# Patient Record
Sex: Female | Born: 1970 | Race: Black or African American | Hispanic: No | Marital: Single | Smoking: Current every day smoker
Health system: Southern US, Community
[De-identification: ages and names within clinical notes are randomized; demographics above are authoritative.]

## PROBLEM LIST (undated history)

## (undated) DIAGNOSIS — Z9289 Personal history of other medical treatment: Secondary | ICD-10-CM

## (undated) DIAGNOSIS — D649 Anemia, unspecified: Secondary | ICD-10-CM

## (undated) DIAGNOSIS — J45909 Unspecified asthma, uncomplicated: Secondary | ICD-10-CM

## (undated) DIAGNOSIS — K219 Gastro-esophageal reflux disease without esophagitis: Secondary | ICD-10-CM

## (undated) DIAGNOSIS — J302 Other seasonal allergic rhinitis: Secondary | ICD-10-CM

## (undated) HISTORY — PX: WISDOM TOOTH EXTRACTION: SHX21

## (undated) HISTORY — PX: TUBAL LIGATION: SHX77

---

## 1998-07-25 ENCOUNTER — Emergency Department (HOSPITAL_COMMUNITY): Admission: EM | Admit: 1998-07-25 | Discharge: 1998-07-25 | Payer: Self-pay

## 2000-09-09 ENCOUNTER — Encounter: Payer: Self-pay | Admitting: *Deleted

## 2000-09-09 ENCOUNTER — Ambulatory Visit (HOSPITAL_COMMUNITY): Admission: RE | Admit: 2000-09-09 | Discharge: 2000-09-09 | Payer: Self-pay | Admitting: *Deleted

## 2000-09-14 ENCOUNTER — Inpatient Hospital Stay (HOSPITAL_COMMUNITY): Admission: AD | Admit: 2000-09-14 | Discharge: 2000-09-14 | Payer: Self-pay | Admitting: *Deleted

## 2000-10-14 ENCOUNTER — Inpatient Hospital Stay (HOSPITAL_COMMUNITY): Admission: AD | Admit: 2000-10-14 | Discharge: 2000-10-14 | Payer: Self-pay | Admitting: *Deleted

## 2000-11-09 ENCOUNTER — Inpatient Hospital Stay (HOSPITAL_COMMUNITY): Admission: AD | Admit: 2000-11-09 | Discharge: 2000-11-09 | Payer: Self-pay | Admitting: *Deleted

## 2002-06-17 DIAGNOSIS — Z9289 Personal history of other medical treatment: Secondary | ICD-10-CM

## 2002-06-17 HISTORY — DX: Personal history of other medical treatment: Z92.89

## 2013-09-21 ENCOUNTER — Other Ambulatory Visit: Payer: Self-pay | Admitting: Obstetrics and Gynecology

## 2013-09-25 ENCOUNTER — Encounter (HOSPITAL_COMMUNITY): Payer: Self-pay | Admitting: Pharmacist

## 2013-09-26 ENCOUNTER — Encounter (HOSPITAL_COMMUNITY): Payer: Self-pay | Admitting: *Deleted

## 2013-09-26 NOTE — Progress Notes (Signed)
SDS BB History Log Sheet given to Lab for history of blood transfusion 06/2002 in [place], [place].

## 2013-10-09 ENCOUNTER — Encounter (HOSPITAL_COMMUNITY)
Admission: RE | Disposition: A | Discharge status: Home / Self Care | Payer: Self-pay | Source: Ambulatory Visit | Attending: Obstetrics and Gynecology

## 2013-10-09 ENCOUNTER — Ambulatory Visit (HOSPITAL_COMMUNITY)
Admission: RE | Admit: 2013-10-09 | Discharge: 2013-10-09 | Disposition: A | Discharge status: Home / Self Care | Payer: Medicaid Other | Source: Ambulatory Visit | Attending: Obstetrics and Gynecology | Admitting: Obstetrics and Gynecology

## 2013-10-09 ENCOUNTER — Ambulatory Visit (HOSPITAL_COMMUNITY): Payer: Medicaid Other | Admitting: Anesthesiology

## 2013-10-09 ENCOUNTER — Encounter (HOSPITAL_COMMUNITY): Payer: Medicaid Other | Admitting: Anesthesiology

## 2013-10-09 ENCOUNTER — Encounter (HOSPITAL_COMMUNITY): Payer: Self-pay | Admitting: *Deleted

## 2013-10-09 DIAGNOSIS — F17200 Nicotine dependence, unspecified, uncomplicated: Secondary | ICD-10-CM | POA: Insufficient documentation

## 2013-10-09 DIAGNOSIS — N920 Excessive and frequent menstruation with regular cycle: Secondary | ICD-10-CM | POA: Insufficient documentation

## 2013-10-09 HISTORY — DX: Unspecified asthma, uncomplicated: J45.909

## 2013-10-09 HISTORY — DX: Personal history of other medical treatment: Z92.89

## 2013-10-09 HISTORY — DX: Other seasonal allergic rhinitis: J30.2

## 2013-10-09 HISTORY — PX: HYSTEROSCOPY WITH D & C: SHX1775

## 2013-10-09 LAB — CBC
HCT: 38.5 % (ref 36.0–46.0)
Hemoglobin: 12.2 g/dL (ref 12.0–15.0)
MCH: 25.7 pg — ABNORMAL LOW (ref 26.0–34.0)
MCHC: 31.7 g/dL (ref 30.0–36.0)
MCV: 81.2 fL (ref 78.0–100.0)
Platelets: 197 10*3/uL (ref 150–400)
RBC: 4.74 MIL/uL (ref 3.87–5.11)
RDW: 16.5 % — ABNORMAL HIGH (ref 11.5–15.5)
WBC: 7.1 10*3/uL (ref 4.0–10.5)

## 2013-10-09 SURGERY — DILATATION AND CURETTAGE /HYSTEROSCOPY
Anesthesia: General | Site: Vagina

## 2013-10-09 MED ORDER — LIDOCAINE HCL 1 % IJ SOLN
INTRAMUSCULAR | Status: AC
  Filled 2013-10-09: qty 20

## 2013-10-09 MED ORDER — OXYCODONE-ACETAMINOPHEN 5-325 MG PO TABS
ORAL_TABLET | ORAL | Status: AC
  Filled 2013-10-09: qty 1

## 2013-10-09 MED ORDER — LIDOCAINE HCL (CARDIAC) 20 MG/ML IV SOLN
INTRAVENOUS | Status: AC
  Filled 2013-10-09: qty 5

## 2013-10-09 MED ORDER — DEXAMETHASONE SODIUM PHOSPHATE 10 MG/ML IJ SOLN
INTRAMUSCULAR | Status: AC
  Filled 2013-10-09: qty 1

## 2013-10-09 MED ORDER — FENTANYL CITRATE 0.05 MG/ML IJ SOLN
INTRAMUSCULAR | Status: DC | PRN
  Administered 2013-10-09: 100 ug via INTRAVENOUS

## 2013-10-09 MED ORDER — KETOROLAC TROMETHAMINE 30 MG/ML IJ SOLN
INTRAMUSCULAR | Status: AC
  Filled 2013-10-09: qty 1

## 2013-10-09 MED ORDER — LIDOCAINE HCL (CARDIAC) 20 MG/ML IV SOLN
INTRAVENOUS | Status: DC | PRN
  Administered 2013-10-09: 50 mg via INTRAVENOUS

## 2013-10-09 MED ORDER — KETOROLAC TROMETHAMINE 30 MG/ML IJ SOLN
INTRAMUSCULAR | Status: DC | PRN
  Administered 2013-10-09: 30 mg via INTRAVENOUS

## 2013-10-09 MED ORDER — OXYCODONE-ACETAMINOPHEN 5-325 MG PO TABS
1.0000 | ORAL_TABLET | ORAL | Status: DC | PRN
  Administered 2013-10-09: 1 via ORAL

## 2013-10-09 MED ORDER — LACTATED RINGERS IV SOLN
INTRAVENOUS | Status: DC
  Administered 2013-10-09: 11:00:00 via INTRAVENOUS

## 2013-10-09 MED ORDER — PROPOFOL 10 MG/ML IV BOLUS
INTRAVENOUS | Status: DC | PRN
  Administered 2013-10-09: 200 mg via INTRAVENOUS

## 2013-10-09 MED ORDER — GLYCINE 1.5 % IR SOLN
Status: DC | PRN
  Administered 2013-10-09: 3000 mL

## 2013-10-09 MED ORDER — FENTANYL CITRATE 0.05 MG/ML IJ SOLN
25.0000 ug | INTRAMUSCULAR | Status: DC | PRN
  Administered 2013-10-09 (×3): 50 ug via INTRAVENOUS

## 2013-10-09 MED ORDER — MIDAZOLAM HCL 2 MG/2ML IJ SOLN
INTRAMUSCULAR | Status: AC
  Filled 2013-10-09: qty 2

## 2013-10-09 MED ORDER — ONDANSETRON HCL 4 MG/2ML IJ SOLN
INTRAMUSCULAR | Status: AC
  Filled 2013-10-09: qty 2

## 2013-10-09 MED ORDER — FENTANYL CITRATE 0.05 MG/ML IJ SOLN
INTRAMUSCULAR | Status: AC
  Filled 2013-10-09: qty 2

## 2013-10-09 MED ORDER — DEXAMETHASONE SODIUM PHOSPHATE 10 MG/ML IJ SOLN
INTRAMUSCULAR | Status: DC | PRN
Start: 2013-10-09 — End: 2013-10-09
  Administered 2013-10-09: 10 mg via INTRAVENOUS

## 2013-10-09 MED ORDER — OXYCODONE-ACETAMINOPHEN 5-325 MG PO TABS: 1.0000 | ORAL_TABLET | Freq: Four times a day (QID) | ORAL | Status: DC | PRN

## 2013-10-09 MED ORDER — MIDAZOLAM HCL 5 MG/5ML IJ SOLN
INTRAMUSCULAR | Status: DC | PRN
  Administered 2013-10-09: 2 mg via INTRAVENOUS

## 2013-10-09 MED ORDER — PROPOFOL 10 MG/ML IV EMUL
INTRAVENOUS | Status: AC
  Filled 2013-10-09: qty 20

## 2013-10-09 MED ORDER — ONDANSETRON HCL 4 MG/2ML IJ SOLN
INTRAMUSCULAR | Status: DC | PRN
  Administered 2013-10-09: 4 mg via INTRAVENOUS

## 2013-10-09 MED ORDER — FENTANYL CITRATE 0.05 MG/ML IJ SOLN
INTRAMUSCULAR | Status: AC
  Administered 2013-10-09: 50 ug via INTRAVENOUS
  Filled 2013-10-09: qty 2

## 2013-10-09 SURGICAL SUPPLY — 24 items
CANISTER SUCT 3000ML (MISCELLANEOUS) ×3 IMPLANT
CATH ROBINSON RED A/P 16FR (CATHETERS) ×1 IMPLANT
CLOTH BEACON ORANGE TIMEOUT ST (SAFETY) ×3 IMPLANT
CONTAINER PREFILL 10% NBF 60ML (FORM) ×4 IMPLANT
DRAPE HYSTEROSCOPY (DRAPE) ×3 IMPLANT
DRSG TELFA 3X8 NADH (GAUZE/BANDAGES/DRESSINGS) ×3 IMPLANT
ELECT REM PT RETURN 9FT ADLT (ELECTROSURGICAL) ×3
ELECTRODE REM PT RTRN 9FT ADLT (ELECTROSURGICAL) ×1 IMPLANT
GLOVE BIO SURGEON STRL SZ 6.5 (GLOVE) ×2 IMPLANT
GLOVE BIO SURGEONS STRL SZ 6.5 (GLOVE) ×1
GLOVE BIOGEL PI IND STRL 6.5 (GLOVE) ×1 IMPLANT
GLOVE BIOGEL PI INDICATOR 6.5 (GLOVE) ×2
GOWN STRL REUS W/TWL LRG LVL3 (GOWN DISPOSABLE) ×6 IMPLANT
LOOP ANGLED CUTTING 22FR (CUTTING LOOP) IMPLANT
NDL SPNL 22GX3.5 QUINCKE BK (NEEDLE) ×1 IMPLANT
NEEDLE SPNL 22GX3.5 QUINCKE BK (NEEDLE) IMPLANT
PACK VAGINAL MINOR WOMEN LF (CUSTOM PROCEDURE TRAY) ×3 IMPLANT
PAD DRESSING TELFA 3X8 NADH (GAUZE/BANDAGES/DRESSINGS) ×1 IMPLANT
PAD OB MATERNITY 4.3X12.25 (PERSONAL CARE ITEMS) ×3 IMPLANT
SET TUBING HYSTEROSCOPY 2 NDL (TUBING) ×2 IMPLANT
SYR CONTROL 10ML LL (SYRINGE) ×1 IMPLANT
TOWEL OR 17X24 6PK STRL BLUE (TOWEL DISPOSABLE) ×6 IMPLANT
TUBE HYSTEROSCOPY W Y-CONNECT (TUBING) ×2 IMPLANT
WATER STERILE IRR 1000ML POUR (IV SOLUTION) ×3 IMPLANT

## 2013-10-09 NOTE — Transfer of Care (Signed)
Immediate Anesthesia Transfer of Care Note  Patient: Amy [name]  Procedure(s) Performed: Procedure(s): DILATATION AND CURETTAGE /HYSTEROSCOPY (N/A)  Patient Location: PACU  Anesthesia Type:General  Level of Consciousness: awake, alert , oriented and patient cooperative  Airway & Oxygen Therapy: Patient Spontanous Breathing and Patient connected to nasal cannula oxygen  Post-op Assessment: Report given to PACU RN, Post -op Vital signs reviewed and stable and Patient moving all extremities X 4  Post vital signs: Reviewed and stable  Complications: No apparent anesthesia complications

## 2013-10-09 NOTE — Brief Op Note (Signed)
10/09/2013  12:28 PM  PATIENT:  Amy [name]  43 y.o. female  PRE-OPERATIVE DIAGNOSIS:  Menorrhagia  POST-OPERATIVE DIAGNOSIS:  Menorrhagia  PROCEDURE:  Procedure(s): DILATATION AND CURETTAGE /HYSTEROSCOPY (N/A)  SURGEON:  Surgeon(s) and Role:    * Allyn Kenner, DO - Primary  ANESTHESIA:   IV sedation, MAC  EBL:  Total I/O In: -  Out: 50 [Urine:50]  BLOOD ADMINISTERED:none    SPECIMEN:  Source of Specimen:  EMB  DISPOSITION OF SPECIMEN:  PATHOLOGY  FINDINGS: 7.5cm RV uterus, moderate tissue, no abnormalities of endometrial cavity  PATIENT DISPOSITION:  PACU - hemodynamically stable.   Delay start of Pharmacological VTE agent (>24hrs) due to surgical blood loss or risk of bleeding: no

## 2013-10-09 NOTE — Discharge Instructions (Signed)
DISCHARGE INSTRUCTIONS:  D&C/ HYSTEROSCOPY  The following instructions have been prepared to help you care for yourself upon your return home.  **You may begin taking Ibuprofen containing medications(Advil, Motrin, Aleve) after 6:30 pm tonight**  Personal hygiene:  Use sanitary pads for vaginal drainage, not tampons.  Shower the day after your procedure.  NO tub baths, pools or Jacuzzis for 2-3 weeks.  Wipe front to back after using the bathroom.  Activity and limitations:  Do NOT drive or operate any equipment for 24 hours. The effects of anesthesia are still present and drowsiness may result.  Do NOT rest in bed all day.  Walking is encouraged.  Walk up and down stairs slowly.  You may resume your normal activity in one to two days or as indicated by your physician. Sexual activity: NO intercourse for at least 2 weeks after the procedure, or as indicated by your Doctor.  Diet: Eat a light meal as desired this evening. You may resume your usual diet tomorrow.  Return to Work: You may resume your work activities in one to two days or as indicated by Marine scientist.  What to expect after your surgery: Expect to have vaginal bleeding/discharge for 2-3 days and spotting for up to 10 days. It is not unusual to have soreness for up to 1-2 weeks. You may have a slight burning sensation when you urinate for the first day. Mild cramps may continue for a couple of days. You may have a regular period in 2-6 weeks.  Call your doctor for any of the following:  Excessive vaginal bleeding or clotting, saturating and changing one pad every hour.  Inability to urinate 6 hours after discharge from hospital.  Pain not relieved by pain medication.  Fever of 100.4 F or greater.  Unusual vaginal discharge or odor.  Return to office in 2 weeks.  Call for an appointment ASAP.  Patients signature: ______________________  Nurses signature ________________________  Support person's  signature____________________________

## 2013-10-09 NOTE — Anesthesia Preprocedure Evaluation (Signed)
Anesthesia Evaluation  Patient identified by MRN, date of birth, ID band Patient awake    Reviewed: Allergy & Precautions, H&P , Patient's Chart, lab work & pertinent test results, reviewed documented beta blocker date and time   Airway Mallampati: II  TM Distance: >3 FB Neck ROM: full    Dental no notable dental hx.    Pulmonary Current Smoker,  breath sounds clear to auscultation  Pulmonary exam normal       Cardiovascular Rhythm:regular Rate:Normal     Neuro/Psych    GI/Hepatic   Endo/Other    Renal/GU      Musculoskeletal   Abdominal   Peds  Hematology   Anesthesia Other Findings   Reproductive/Obstetrics                            Anesthesia Physical Anesthesia Plan  ASA: II  Anesthesia Plan:    Post-op Pain Management:    Induction: Intravenous  Airway Management Planned: LMA  Additional Equipment:   Intra-op Plan:   Post-operative Plan:   Informed Consent: I have reviewed the patients History and Physical, chart, labs and discussed the procedure including the risks, benefits and alternatives for the proposed anesthesia with the patient or authorized representative who has indicated his/her understanding and acceptance.   Dental Advisory Given and Dental advisory given  Plan Discussed with: CRNA and Surgeon  Anesthesia Plan Comments: (Discussed GA with LMA, possible sore throat, potential need to switch to ETT, N/V, pulmonary aspiration. Questions answered. )        Anesthesia Quick Evaluation  

## 2013-10-09 NOTE — Anesthesia Postprocedure Evaluation (Signed)
  Anesthesia Post-op Note  Patient: Amy [name]  Procedure(s) Performed: Procedure(s): DILATATION AND CURETTAGE /HYSTEROSCOPY (N/A) Patient is awake and responsive. Pain and nausea are reasonably well controlled. Vital signs are stable and clinically acceptable. Oxygen saturation is clinically acceptable. There are no apparent anesthetic complications at this time. Patient is ready for discharge.

## 2013-10-09 NOTE — H&P (Signed)
43 y.o. complains of a long history of heavy periods, but over the past year her cycles have become more frequent.  She also reports that she sometimes changes a pad every hour for 4 hrs in a row.    Past Medical History  Diagnosis Date  . SVD (spontaneous vaginal delivery)     x 3  . Asthma   . Seasonal allergies   . History of blood transfusion 06/2002    with 3rd child  [place], [place] - unknown units transfused   Past Surgical History  Procedure Laterality Date  . Tubal ligation    . Wisdom tooth extraction     Hernia repair  History   Social History  . Marital Status: Single    Spouse Name: N/A    Number of Children: N/A  . Years of Education: N/A   Occupational History  . Not on file.   Social History Main Topics  . Smoking status: Current Every Day Smoker -- 0.50 packs/day for 15 years  . Smokeless tobacco: Never Used  . Alcohol Use: Yes     Comment: socially  . Drug Use: No  . Sexual Activity: Yes    Birth Control/ Protection: Surgical   Other Topics Concern  . Not on file   Social History Narrative  . No narrative on file    No current facility-administered medications on file prior to encounter.   No current outpatient prescriptions on file prior to encounter.    Allergies: LATEX  @VITALS2 @  Lungs: clear to ascultation Cor:  RRR Abdomen:  soft, nontender, nondistended. Ex:  no cords, erythema Pelvic:  Def to OR  A:  Menrrhagia   P: D&C.   All risks, benefits and alternatives d/w patient and she desires to proceed procedure. Routine pre-op care. No abx needed.  SCDs, IVF. Hx of anemia and transfusion 2003.  Recent Hb 11.5  TSH wnl  Bernice Mcauliffe

## 2013-10-10 ENCOUNTER — Encounter (HOSPITAL_COMMUNITY): Payer: Self-pay | Admitting: Obstetrics and Gynecology

## 2013-10-18 NOTE — Op Note (Signed)
[name], [name]            ACCOUNT NO.:  [id number]  MEDICAL RECORD NO.:  [id number]  LOCATION:  [facility]                          FACILITY:  [facility]  PHYSICIAN:  Allyn Kenner, DO    DATE OF BIRTH:  26-Jan-1971  DATE OF PROCEDURE: DATE OF DISCHARGE:  10/09/2013                              OPERATIVE REPORT   PREOPERATIVE DIAGNOSIS:  Menorrhagia.  POSTOPERATIVE DIAGNOSIS:  Menorrhagia.  PROCEDURE:  Dilation, curettage, and hysteroscopy.  SURGEON:  Allyn Kenner, DO.  ANESTHESIA:  IV sedation.  URINE OUTPUT:  50 mL, catheterized.  SPECIMEN:  Endometrial biopsy to pathology.  FINDINGS:  Uterus sounded to 7.5 cm retroverted and moderate tissue removed.  No abnormalities noted of the endometrial cavity.  DISPOSITION OF PATIENT:  PACU.  DESCRIPTION OF PROCEDURE:  The patient was taken to the operating room, where IV sedation was administered and found to be adequate.  She was then prepped and draped in normal sterile fashion in dorsal lithotomy position.  The lid speculum placed in the vagina and, and the anterior lip of the cervix was grasped with tenaculum and the cervix was dilated fairly to the [employer].  The curette was entered gently through the cervix to the fundus of the uterus, and curettage of all 4 quadrants was performed.  This portion of the procedure was accomplished after hysteroscope was entered and the uterine cavity was visualized.  Both ostia seen and no abnormalities noted.  Specimens sent to pathology.  No further bleeding was found.  The tenaculum was removed and tenaculum sites found to be hemostatic.  All other instruments were removed from the vagina and the patient was taken to recovery in stable condition.          ______________________________ Allyn Kenner, DO    [place]/MEDQ  D:  10/17/2013  T:  10/18/2013  Job:  [id number]

## 2013-12-12 ENCOUNTER — Encounter (HOSPITAL_COMMUNITY): Payer: Self-pay | Admitting: Pharmacist

## 2013-12-26 ENCOUNTER — Encounter (HOSPITAL_COMMUNITY): Payer: Self-pay

## 2013-12-26 ENCOUNTER — Encounter (HOSPITAL_COMMUNITY)
Admission: RE | Admit: 2013-12-26 | Discharge: 2013-12-26 | Disposition: A | Discharge status: Home / Self Care | Payer: Medicaid Other | Source: Ambulatory Visit | Attending: Obstetrics and Gynecology | Admitting: Obstetrics and Gynecology

## 2013-12-26 HISTORY — DX: Gastro-esophageal reflux disease without esophagitis: K21.9

## 2013-12-26 HISTORY — DX: Anemia, unspecified: D64.9

## 2013-12-26 LAB — BASIC METABOLIC PANEL
BUN: 13 mg/dL (ref 6–23)
CALCIUM: 9.3 mg/dL (ref 8.4–10.5)
CO2: 26 mEq/L (ref 19–32)
Chloride: 102 mEq/L (ref 96–112)
Creatinine, Ser: 0.75 mg/dL (ref 0.50–1.10)
GFR calc Af Amer: >90 mL/min (ref >90)
GLUCOSE: 71 mg/dL (ref 70–99)
POTASSIUM: 3.5 meq/L — AB (ref 3.7–5.3)
SODIUM: 140 meq/L (ref 137–147)

## 2013-12-26 LAB — CBC
HCT: 36.7 % (ref 36.0–46.0)
Hemoglobin: 12.1 g/dL (ref 12.0–15.0)
MCH: 27.2 pg (ref 26.0–34.0)
MCHC: 33 g/dL (ref 30.0–36.0)
MCV: 82.5 fL (ref 78.0–100.0)
PLATELETS: 229 10*3/uL (ref 150–400)
RBC: 4.45 MIL/uL (ref 3.87–5.11)
RDW: 17.2 % — ABNORMAL HIGH (ref 11.5–15.5)
WBC: 6.3 10*3/uL (ref 4.0–10.5)

## 2013-12-26 NOTE — Pre-Procedure Instructions (Signed)
Dr. Seward Speck made aware of pts increased blood pressure with no history of hypertension.  EKG ordered and performed.  Dr. Glennon Mac made aware of normal sinus rhythm noted on EKG. No new orders received.

## 2013-12-26 NOTE — Patient Instructions (Addendum)
Your procedure is scheduled on: Wednesday, Dec 27, 2013  Enter through the [employer] [facility] at: 7:00am  Pick up the phone at the desk and dial [phone].  Call this number if you have problems the morning of surgery: [phone].  Remember: Do NOT eat food: AFTER MIDNIGHT TUESDAY Do NOT drink clear liquids after: AFTER MIDNIGHT TUESDAY Take these medicines the morning of surgery with a SIP OF WATER: BRING ASTHMA INHALER WITH YOU DAY OF SURGERY  Do NOT wear jewelry (body piercing), metal hair clips/bobby pins, make-up, or nail polish. Do NOT wear lotions, powders, or perfumes.  You may wear deoderant. Do NOT shave for 48 hours prior to surgery. Do NOT bring valuables to the hospital. Contacts, dentures, or bridgework may not be worn into surgery. Leave suitcase in car.  After surgery it may be brought to your room.  For patients admitted to the hospital, checkout time is 11:00 AM the day of discharge.

## 2013-12-27 ENCOUNTER — Encounter (HOSPITAL_COMMUNITY): Payer: Medicaid Other | Admitting: Anesthesiology

## 2013-12-27 ENCOUNTER — Encounter (HOSPITAL_COMMUNITY)
Admission: RE | Disposition: A | Discharge status: Home / Self Care | Payer: Self-pay | Source: Ambulatory Visit | Attending: Obstetrics and Gynecology

## 2013-12-27 ENCOUNTER — Inpatient Hospital Stay (HOSPITAL_COMMUNITY)
Admission: RE | Admit: 2013-12-27 | Discharge: 2013-12-28 | DRG: 743 | Disposition: A | Discharge status: Home / Self Care | Payer: Medicaid Other | Source: Ambulatory Visit | Attending: Obstetrics and Gynecology | Admitting: Obstetrics and Gynecology

## 2013-12-27 ENCOUNTER — Encounter (HOSPITAL_COMMUNITY): Payer: Self-pay | Admitting: *Deleted

## 2013-12-27 ENCOUNTER — Ambulatory Visit (HOSPITAL_COMMUNITY): Payer: Medicaid Other | Admitting: Anesthesiology

## 2013-12-27 DIAGNOSIS — N920 Excessive and frequent menstruation with regular cycle: Principal | ICD-10-CM | POA: Diagnosis present

## 2013-12-27 DIAGNOSIS — D279 Benign neoplasm of unspecified ovary: Secondary | ICD-10-CM | POA: Diagnosis present

## 2013-12-27 DIAGNOSIS — F17200 Nicotine dependence, unspecified, uncomplicated: Secondary | ICD-10-CM | POA: Diagnosis present

## 2013-12-27 DIAGNOSIS — Z90710 Acquired absence of both cervix and uterus: Secondary | ICD-10-CM | POA: Diagnosis present

## 2013-12-27 DIAGNOSIS — K219 Gastro-esophageal reflux disease without esophagitis: Secondary | ICD-10-CM | POA: Diagnosis present

## 2013-12-27 DIAGNOSIS — N7013 Chronic salpingitis and oophoritis: Secondary | ICD-10-CM | POA: Diagnosis present

## 2013-12-27 DIAGNOSIS — J45909 Unspecified asthma, uncomplicated: Secondary | ICD-10-CM | POA: Diagnosis present

## 2013-12-27 HISTORY — PX: ROBOTIC ASSISTED TOTAL HYSTERECTOMY: SHX6085

## 2013-12-27 HISTORY — PX: CYSTOSCOPY: SHX5120

## 2013-12-27 HISTORY — PX: OOPHORECTOMY: SHX6387

## 2013-12-27 SURGERY — ROBOTIC ASSISTED TOTAL HYSTERECTOMY
Anesthesia: General | Site: Urethra | Laterality: Right

## 2013-12-27 MED ORDER — KETOROLAC TROMETHAMINE 30 MG/ML IJ SOLN
15.0000 mg | Freq: Once | INTRAMUSCULAR | Status: AC | PRN
  Administered 2013-12-27: 30 mg via INTRAVENOUS

## 2013-12-27 MED ORDER — LACTATED RINGERS IV SOLN
INTRAVENOUS | Status: DC
  Administered 2013-12-27: 07:00:00 via INTRAVENOUS

## 2013-12-27 MED ORDER — LACTATED RINGERS IV SOLN
INTRAVENOUS | Status: DC
  Administered 2013-12-27: 09:00:00 via INTRAVENOUS

## 2013-12-27 MED ORDER — OXYCODONE HCL 5 MG PO TABS: 5.0000 mg | ORAL_TABLET | Freq: Once | ORAL | Status: DC | PRN

## 2013-12-27 MED ORDER — ONDANSETRON HCL 4 MG/2ML IJ SOLN: 4.0000 mg | Freq: Four times a day (QID) | INTRAMUSCULAR | Status: DC | PRN

## 2013-12-27 MED ORDER — ONDANSETRON HCL 4 MG PO TABS: 4.0000 mg | ORAL_TABLET | Freq: Four times a day (QID) | ORAL | Status: DC | PRN

## 2013-12-27 MED ORDER — FENTANYL CITRATE 0.05 MG/ML IJ SOLN
INTRAMUSCULAR | Status: AC
  Filled 2013-12-27: qty 5

## 2013-12-27 MED ORDER — KETOROLAC TROMETHAMINE 30 MG/ML IJ SOLN
INTRAMUSCULAR | Status: AC
  Filled 2013-12-27: qty 1

## 2013-12-27 MED ORDER — PROPOFOL 10 MG/ML IV BOLUS
INTRAVENOUS | Status: DC | PRN
  Administered 2013-12-27: 150 mg via INTRAVENOUS
  Administered 2013-12-27: 50 mg via INTRAVENOUS

## 2013-12-27 MED ORDER — OXYCODONE HCL 5 MG/5ML PO SOLN: 5.0000 mg | Freq: Once | ORAL | Status: DC | PRN

## 2013-12-27 MED ORDER — PROMETHAZINE HCL 25 MG/ML IJ SOLN: 6.2500 mg | INTRAMUSCULAR | Status: DC | PRN

## 2013-12-27 MED ORDER — MIDAZOLAM HCL 2 MG/2ML IJ SOLN
INTRAMUSCULAR | Status: AC
  Filled 2013-12-27: qty 2

## 2013-12-27 MED ORDER — ACETAMINOPHEN 160 MG/5ML PO SOLN: 325.0000 mg | ORAL | Status: DC | PRN

## 2013-12-27 MED ORDER — CEFAZOLIN SODIUM-DEXTROSE 2-3 GM-% IV SOLR
2.0000 g | INTRAVENOUS | Status: AC
  Administered 2013-12-27: 2 g via INTRAVENOUS

## 2013-12-27 MED ORDER — PROPOFOL 10 MG/ML IV EMUL
INTRAVENOUS | Status: AC
  Filled 2013-12-27: qty 20

## 2013-12-27 MED ORDER — OXYCODONE-ACETAMINOPHEN 5-325 MG PO TABS
1.0000 | ORAL_TABLET | ORAL | Status: DC | PRN
  Administered 2013-12-27 – 2013-12-28 (×5): 2 via ORAL
  Filled 2013-12-27 (×6): qty 2

## 2013-12-27 MED ORDER — NEOSTIGMINE METHYLSULFATE 10 MG/10ML IV SOLN
INTRAVENOUS | Status: AC
  Filled 2013-12-27: qty 1

## 2013-12-27 MED ORDER — FENTANYL CITRATE 0.05 MG/ML IJ SOLN
25.0000 ug | INTRAMUSCULAR | Status: DC | PRN
  Administered 2013-12-27 (×2): 25 ug via INTRAVENOUS

## 2013-12-27 MED ORDER — ONDANSETRON HCL 4 MG/2ML IJ SOLN
INTRAMUSCULAR | Status: DC | PRN
  Administered 2013-12-27: 4 mg via INTRAVENOUS

## 2013-12-27 MED ORDER — HYDROMORPHONE HCL PF 1 MG/ML IJ SOLN
INTRAMUSCULAR | Status: AC
  Administered 2013-12-27: 0.5 mg via INTRAVENOUS
  Filled 2013-12-27: qty 1

## 2013-12-27 MED ORDER — ROCURONIUM BROMIDE 100 MG/10ML IV SOLN
INTRAVENOUS | Status: DC | PRN
  Administered 2013-12-27: 50 mg via INTRAVENOUS
  Administered 2013-12-27: 10 mg via INTRAVENOUS

## 2013-12-27 MED ORDER — PNEUMOCOCCAL VAC POLYVALENT 25 MCG/0.5ML IJ INJ
0.5000 mL | INJECTION | INTRAMUSCULAR | Status: AC
  Administered 2013-12-28: 0.5 mL via INTRAMUSCULAR
  Filled 2013-12-27: qty 0.5

## 2013-12-27 MED ORDER — FENTANYL CITRATE 0.05 MG/ML IJ SOLN
INTRAMUSCULAR | Status: AC
  Filled 2013-12-27: qty 2

## 2013-12-27 MED ORDER — METHYLENE BLUE 1 % INJ SOLN
INTRAMUSCULAR | Status: AC
  Filled 2013-12-27: qty 10

## 2013-12-27 MED ORDER — CEFAZOLIN SODIUM-DEXTROSE 2-3 GM-% IV SOLR
INTRAVENOUS | Status: AC
  Filled 2013-12-27: qty 50

## 2013-12-27 MED ORDER — BUPIVACAINE HCL (PF) 0.25 % IJ SOLN
INTRAMUSCULAR | Status: AC
  Filled 2013-12-27: qty 30

## 2013-12-27 MED ORDER — IBUPROFEN 600 MG PO TABS
600.0000 mg | ORAL_TABLET | Freq: Four times a day (QID) | ORAL | Status: DC | PRN
  Administered 2013-12-27: 600 mg via ORAL
  Filled 2013-12-27: qty 1

## 2013-12-27 MED ORDER — LIDOCAINE HCL (CARDIAC) 20 MG/ML IV SOLN
INTRAVENOUS | Status: DC | PRN
  Administered 2013-12-27: 100 mg via INTRAVENOUS

## 2013-12-27 MED ORDER — LACTATED RINGERS IR SOLN
Status: DC | PRN
  Administered 2013-12-27: 3000 mL

## 2013-12-27 MED ORDER — NEOSTIGMINE METHYLSULFATE 10 MG/10ML IV SOLN
INTRAVENOUS | Status: DC | PRN
  Administered 2013-12-27: 4 mg via INTRAVENOUS

## 2013-12-27 MED ORDER — NICOTINE 14 MG/24HR TD PT24
14.0000 mg | MEDICATED_PATCH | Freq: Every day | TRANSDERMAL | Status: DC
  Administered 2013-12-27 – 2013-12-28 (×2): 14 mg via TRANSDERMAL
  Filled 2013-12-27 (×2): qty 1

## 2013-12-27 MED ORDER — GLYCOPYRROLATE 0.2 MG/ML IJ SOLN
INTRAMUSCULAR | Status: AC
  Filled 2013-12-27: qty 3

## 2013-12-27 MED ORDER — FENTANYL CITRATE 0.05 MG/ML IJ SOLN
INTRAMUSCULAR | Status: DC | PRN
  Administered 2013-12-27 (×3): 50 ug via INTRAVENOUS
  Administered 2013-12-27: 150 ug via INTRAVENOUS
  Administered 2013-12-27: 50 ug via INTRAVENOUS
  Administered 2013-12-27: 100 ug via INTRAVENOUS
  Administered 2013-12-27: 50 ug via INTRAVENOUS
  Administered 2013-12-27: 100 ug via INTRAVENOUS

## 2013-12-27 MED ORDER — ALBUTEROL SULFATE (2.5 MG/3ML) 0.083% IN NEBU: 2.5000 mg | INHALATION_SOLUTION | Freq: Four times a day (QID) | RESPIRATORY_TRACT | Status: DC | PRN

## 2013-12-27 MED ORDER — HYDROMORPHONE HCL PF 1 MG/ML IJ SOLN
0.5000 mg | INTRAMUSCULAR | Status: DC | PRN
  Administered 2013-12-27: 0.5 mg via INTRAVENOUS

## 2013-12-27 MED ORDER — LIDOCAINE HCL (CARDIAC) 20 MG/ML IV SOLN
INTRAVENOUS | Status: AC
  Filled 2013-12-27: qty 5

## 2013-12-27 MED ORDER — ROCURONIUM BROMIDE 100 MG/10ML IV SOLN
INTRAVENOUS | Status: AC
  Filled 2013-12-27: qty 1

## 2013-12-27 MED ORDER — FENTANYL CITRATE 0.05 MG/ML IJ SOLN
INTRAMUSCULAR | Status: AC
  Administered 2013-12-27: 25 ug via INTRAVENOUS
  Filled 2013-12-27: qty 2

## 2013-12-27 MED ORDER — DOCUSATE SODIUM 100 MG PO CAPS
100.0000 mg | ORAL_CAPSULE | Freq: Two times a day (BID) | ORAL | Status: DC
  Administered 2013-12-27 – 2013-12-28 (×2): 100 mg via ORAL
  Filled 2013-12-27 (×2): qty 1

## 2013-12-27 MED ORDER — SIMETHICONE 80 MG PO CHEW: 80.0000 mg | CHEWABLE_TABLET | Freq: Four times a day (QID) | ORAL | Status: DC | PRN

## 2013-12-27 MED ORDER — ACETAMINOPHEN 325 MG PO TABS: 325.0000 mg | ORAL_TABLET | ORAL | Status: DC | PRN

## 2013-12-27 MED ORDER — GLYCOPYRROLATE 0.2 MG/ML IJ SOLN
INTRAMUSCULAR | Status: DC | PRN
  Administered 2013-12-27: 0.6 mg via INTRAVENOUS

## 2013-12-27 MED ORDER — BUPIVACAINE HCL (PF) 0.25 % IJ SOLN
INTRAMUSCULAR | Status: DC | PRN
  Administered 2013-12-27: 5 mL

## 2013-12-27 MED ORDER — MIDAZOLAM HCL 2 MG/2ML IJ SOLN
INTRAMUSCULAR | Status: DC | PRN
  Administered 2013-12-27: 2 mg via INTRAVENOUS

## 2013-12-27 MED ORDER — DEXAMETHASONE SODIUM PHOSPHATE 10 MG/ML IJ SOLN
INTRAMUSCULAR | Status: DC | PRN
  Administered 2013-12-27: 10 mg via INTRAVENOUS

## 2013-12-27 MED ORDER — LACTATED RINGERS IV SOLN
INTRAVENOUS | Status: DC
  Administered 2013-12-27: 17:00:00 via INTRAVENOUS

## 2013-12-27 MED ORDER — ONDANSETRON HCL 4 MG/2ML IJ SOLN
INTRAMUSCULAR | Status: AC
  Filled 2013-12-27: qty 2

## 2013-12-27 MED ORDER — METHYLENE BLUE 1 % INJ SOLN
INTRAMUSCULAR | Status: DC | PRN
  Administered 2013-12-27: 50 mg via INTRAVENOUS

## 2013-12-27 SURGICAL SUPPLY — 61 items
ADH SKN CLS APL DERMABOND .7 (GAUZE/BANDAGES/DRESSINGS) ×4
APL SKNCLS STERI-STRIP NONHPOA (GAUZE/BANDAGES/DRESSINGS) ×4
BAG URINE DRAINAGE (UROLOGICAL SUPPLIES) ×5 IMPLANT
BARRIER ADHS 3X4 INTERCEED (GAUZE/BANDAGES/DRESSINGS) ×5 IMPLANT
BENZOIN TINCTURE PRP APPL 2/3 (GAUZE/BANDAGES/DRESSINGS) ×5 IMPLANT
BRR ADH 4X3 ABS CNTRL BYND (GAUZE/BANDAGES/DRESSINGS) ×4
CATH FOLEY 3WAY  5CC 16FR (CATHETERS) ×1
CATH FOLEY 3WAY 5CC 16FR (CATHETERS) ×4 IMPLANT
CHLORAPREP W/TINT 26ML (MISCELLANEOUS) ×5 IMPLANT
CLOTH BEACON ORANGE TIMEOUT ST (SAFETY) ×5 IMPLANT
CONT PATH 16OZ SNAP LID 3702 (MISCELLANEOUS) ×5 IMPLANT
COVER MAYO STAND STRL (DRAPES) ×5 IMPLANT
COVER TABLE BACK 60X90 (DRAPES) ×10 IMPLANT
COVER TIP SHEARS 8 DVNC (MISCELLANEOUS) ×4 IMPLANT
COVER TIP SHEARS 8MM DA VINCI (MISCELLANEOUS) ×1
DECANTER SPIKE VIAL GLASS SM (MISCELLANEOUS) ×5 IMPLANT
DERMABOND ADVANCED (GAUZE/BANDAGES/DRESSINGS) ×1
DERMABOND ADVANCED .7 DNX12 (GAUZE/BANDAGES/DRESSINGS) ×4 IMPLANT
DRAPE HUG U DISPOSABLE (DRAPE) ×5 IMPLANT
DRAPE LG THREE QUARTER DISP (DRAPES) ×10 IMPLANT
DRAPE WARM FLUID 44X44 (DRAPE) ×5 IMPLANT
ELECT REM PT RETURN 9FT ADLT (ELECTROSURGICAL) ×5
ELECTRODE REM PT RTRN 9FT ADLT (ELECTROSURGICAL) ×4 IMPLANT
EVACUATOR SMOKE 8.L (FILTER) ×5 IMPLANT
GAUZE VASELINE 3X9 (GAUZE/BANDAGES/DRESSINGS) IMPLANT
GLOVE BIO SURGEON STRL SZ7 (GLOVE) ×10 IMPLANT
GLOVE ECLIPSE 6.5 STRL STRAW (GLOVE) ×15 IMPLANT
GOWN STRL REUS W/TWL LRG LVL3 (GOWN DISPOSABLE) ×30 IMPLANT
KIT ACCESSORY DA VINCI DISP (KITS) ×1
KIT ACCESSORY DVNC DISP (KITS) ×4 IMPLANT
LEGGING LITHOTOMY PAIR STRL (DRAPES) ×5 IMPLANT
MANIPULATOR UTERINE 4.5 ZUMI (MISCELLANEOUS) IMPLANT
NEEDLE INSUFFLATION 120MM (ENDOMECHANICALS) ×5 IMPLANT
OCCLUDER COLPOPNEUMO (BALLOONS) ×8 IMPLANT
PACK LAVH (CUSTOM PROCEDURE TRAY) ×5 IMPLANT
PAD PREP 24X48 CUFFED NSTRL (MISCELLANEOUS) ×10 IMPLANT
PLUG CATH AND CAP STER (CATHETERS) ×5 IMPLANT
PROTECTOR NERVE ULNAR (MISCELLANEOUS) ×10 IMPLANT
SET CYSTO W/LG BORE CLAMP LF (SET/KITS/TRAYS/PACK) IMPLANT
SET IRRIG TUBING LAPAROSCOPIC (IRRIGATION / IRRIGATOR) ×5 IMPLANT
SOLUTION ELECTROLUBE (MISCELLANEOUS) ×5 IMPLANT
STRIP CLOSURE SKIN 1/2X4 (GAUZE/BANDAGES/DRESSINGS) ×5 IMPLANT
SUT VIC AB 0 CT1 27 (SUTURE) ×25
SUT VIC AB 0 CT1 27XBRD ANBCTR (SUTURE) ×20 IMPLANT
SUT VIC AB 2-0 CT2 27 (SUTURE) ×10 IMPLANT
SUT VICRYL 0 UR6 27IN ABS (SUTURE) ×10 IMPLANT
SUT VICRYL RAPIDE 3 0 (SUTURE) ×10 IMPLANT
SYR 50ML LL SCALE MARK (SYRINGE) ×5 IMPLANT
SYSTEM CONVERTIBLE TROCAR (TROCAR) ×2 IMPLANT
TIP RUMI ORANGE 6.7MMX12CM (TIP) IMPLANT
TIP UTERINE 5.1X6CM LAV DISP (MISCELLANEOUS) ×2 IMPLANT
TIP UTERINE 6.7X10CM GRN DISP (MISCELLANEOUS) IMPLANT
TIP UTERINE 6.7X6CM WHT DISP (MISCELLANEOUS) IMPLANT
TIP UTERINE 6.7X8CM BLUE DISP (MISCELLANEOUS) IMPLANT
TOWEL OR 17X24 6PK STRL BLUE (TOWEL DISPOSABLE) ×10 IMPLANT
TROCAR DILATING TIP 12MM 150MM (ENDOMECHANICALS) ×2 IMPLANT
TROCAR DISP BLADELESS 8 DVNC (TROCAR) ×4 IMPLANT
TROCAR DISP BLADELESS 8MM (TROCAR) ×1
TROCAR XCEL 12X100 BLDLESS (ENDOMECHANICALS) ×5 IMPLANT
TUBING FILTER THERMOFLATOR (ELECTROSURGICAL) ×5 IMPLANT
WATER STERILE IRR 1000ML POUR (IV SOLUTION) ×15 IMPLANT

## 2013-12-27 NOTE — Anesthesia Procedure Notes (Signed)
Procedure Name: Intubation Date/Time: 12/27/2013 8:44 AM Performed by: Callyn Severtson, Sheron Nightingale Pre-anesthesia Checklist: Patient identified, Timeout performed, Emergency Drugs available, Suction available and Patient being monitored Patient Re-evaluated:Patient Re-evaluated prior to inductionOxygen Delivery Method: Circle system utilized Preoxygenation: Pre-oxygenation with 100% oxygen Intubation Type: IV induction Ventilation: Mask ventilation without difficulty Laryngoscope Size: Miller and 3 Grade View: Grade I Tube type: Oral Tube size: 7.0 mm Number of attempts: 1 Placement Confirmation: ETT inserted through vocal cords under direct vision,  breath sounds checked- equal and bilateral and positive ETCO2 Secured at: 21 cm Dental Injury: Teeth and Oropharynx as per pre-operative assessment

## 2013-12-27 NOTE — Brief Op Note (Signed)
12/27/2013  12:41 PM  PATIENT:  Amy [name]  43 y.o. female  PRE-OPERATIVE DIAGNOSIS:  MENORRHAGIA  POST-OPERATIVE DIAGNOSIS:  menorrhagia  PROCEDURE:  Procedure(s): ROBOTIC ASSISTED TOTAL HYSTERECTOMY, BILATERAL SALPINGECTOMY  (Bilateral) OOPHORECTOMY RIGHT (Right) CYSTOSCOPY (N/A)  SURGEON:  Surgeon(s) and Role:    * Allyn Kenner, DO - Primary    * Daria Pastures, MD - Assisting   ANESTHESIA:   local and general  EBL:  Total I/O In: 2000 [I.V.:2000] Out: 600 [Urine:450; Blood:150]  LOCAL MEDICATIONS USED:  MARCAINE     SPECIMEN:  Source of Specimen:  aspirate from hydrosalpinx, uterus, cervix, bilateral tubes and R ovary  DISPOSITION OF SPECIMEN:  PATHOLOGY  COUNTS:  YES  PLAN OF CARE: Admit to inpatient   PATIENT DISPOSITION:  PACU - hemodynamically stable.

## 2013-12-27 NOTE — H&P (Signed)
43 y.o. complains of menorrhagia.  She presented as a new patient 09/05/13 reporting a long history of heavy periods but over the past year they had become worse, several family members had the same problem and she desired a hysterectomy.  Pelvic US 09/07/13 showed a 8x4x4 cm uterus with EMS approx 4mm and a right ovarian simple septated cyst with no increased blood flow measuring approx 7cm.  D&C performed 10/09/13 showed benign endometrium.  She is a smoker, and was advised to quit smoking prior to surgery, nictotine patches were prescribed.  She was unable to quit but did cut down to 4-5 cigarettes per day.  Past Medical History  Diagnosis Date  . SVD (spontaneous vaginal delivery)     x 3  . Asthma   . Seasonal allergies   . History of blood transfusion 06/2002    with 3rd child  [place], [place] - unknown units transfused  . GERD (gastroesophageal reflux disease)   . Anemia     HISTORY OF ANEMIA   Past Surgical History  Procedure Laterality Date  . Tubal ligation    . Wisdom tooth extraction    . Hysteroscopy w/d&c N/A 10/09/2013    Procedure: DILATATION AND CURETTAGE /HYSTEROSCOPY;  Surgeon: Allyn Kenner, DO;  Location: [facility];  Service: Gynecology;  Laterality: N/A;    History   Social History  . Marital Status: Single    Spouse Name: N/A    Number of Children: N/A  . Years of Education: N/A   Occupational History  . Not on file.   Social History Main Topics  . Smoking status: Current Every Day Smoker -- 0.25 packs/day for 15 years  . Smokeless tobacco: Never Used  . Alcohol Use: Yes     Comment: socially  . Drug Use: No  . Sexual Activity: Yes    Birth Control/ Protection: Surgical   Other Topics Concern  . Not on file   Social History Narrative  . No narrative on file    No current facility-administered medications on file prior to encounter.   Current Outpatient Prescriptions on File Prior to Encounter  Medication Sig Dispense Refill  . albuterol (PROVENTIL  HFA;VENTOLIN HFA) 108 (90 BASE) MCG/ACT inhaler Inhale 2 puffs into the lungs every 6 (six) hours as needed for wheezing or shortness of breath.      . calcium carbonate (TUMS - DOSED IN MG ELEMENTAL CALCIUM) 500 MG chewable tablet Chew 2 tablets by mouth daily as needed for indigestion or heartburn.        Allergies  Allergen Reactions  . Latex     Rash, itching    @VITALS2 @  Lungs: clear to ascultation Cor:  RRR Abdomen:  soft, nontender, nondistended. Ex:  no cords, erythema Pelvic:  Def to OR  A:  Menorrhagia   P:  Robotic assisted total laparoscopic hysterectomy, bilateral salpingectomy, right ovarian cystectomy, possibly right salpingo-oophorectomy, possible cystoscopy, possible exploratory laparotomy.   All risks, benefits and alternatives d/w patient and she desires to proceed with above.  Patient has undergone a modified bowel prep and will receive preop antibiotics and SCDs during the operation.     Allyn Kenner

## 2013-12-27 NOTE — Anesthesia Preprocedure Evaluation (Signed)
Anesthesia Evaluation  Patient identified by MRN, date of birth, ID band Patient awake    Reviewed: Allergy & Precautions  History of Anesthesia Complications Negative for: history of anesthetic complications  Airway Mallampati: II TM Distance: >3 FB Neck ROM: Full    Dental  (+) Teeth Intact   Pulmonary asthma , neg sleep apnea, neg recent URI, Current Smoker,  breath sounds clear to auscultation        Cardiovascular negative cardio ROS  Rhythm:Regular     Neuro/Psych negative neurological ROS  negative psych ROS   GI/Hepatic Neg liver ROS, GERD-  Controlled,  Endo/Other  negative endocrine ROS  Renal/GU negative Renal ROS     Musculoskeletal   Abdominal   Peds  Hematology negative hematology ROS (+)   Anesthesia Other Findings   Reproductive/Obstetrics                           Anesthesia Physical Anesthesia Plan  ASA: II  Anesthesia Plan: General   Post-op Pain Management:    Induction: Intravenous  Airway Management Planned: Oral ETT  Additional Equipment: None  Intra-op Plan:   Post-operative Plan: Extubation in OR  Informed Consent: I have reviewed the patients History and Physical, chart, labs and discussed the procedure including the risks, benefits and alternatives for the proposed anesthesia with the patient or authorized representative who has indicated his/her understanding and acceptance.   Dental advisory given  Plan Discussed with: CRNA and Surgeon  Anesthesia Plan Comments:         Anesthesia Quick Evaluation

## 2013-12-27 NOTE — Anesthesia Postprocedure Evaluation (Signed)
  Anesthesia Post-op Note  Anesthesia Post Note  Patient: Amy [name]  Procedure(s) Performed: Procedure(s) (LRB): ROBOTIC ASSISTED TOTAL HYSTERECTOMY, BILATERAL SALPINGECTOMY  (Bilateral) OOPHORECTOMY RIGHT (Right) CYSTOSCOPY (N/A)  Anesthesia type: General  Patient location: PACU  Post pain: Pain level controlled  Post assessment: Post-op Vital signs reviewed  Last Vitals:  Filed Vitals:   12/27/13 1330  BP: 135/87  Pulse: 78  Temp:   Resp: 14    Post vital signs: Reviewed  Level of consciousness: sedated  Complications: No apparent anesthesia complications

## 2013-12-27 NOTE — Transfer of Care (Signed)
Immediate Anesthesia Transfer of Care Note  Patient: Amy [name]  Procedure(s) Performed: Procedure(s): ROBOTIC ASSISTED TOTAL HYSTERECTOMY, BILATERAL SALPINGECTOMY  (Bilateral) OOPHORECTOMY RIGHT (Right) CYSTOSCOPY (N/A)  Patient Location: PACU  Anesthesia Type:General  Level of Consciousness: awake, alert  and oriented  Airway & Oxygen Therapy: Patient Spontanous Breathing and Patient connected to nasal cannula oxygen  Post-op Assessment: Report given to PACU RN and Post -op Vital signs reviewed and stable  Post vital signs: Reviewed and stable  Complications: No apparent anesthesia complications

## 2013-12-28 ENCOUNTER — Encounter (HOSPITAL_COMMUNITY): Payer: Self-pay | Admitting: Obstetrics and Gynecology

## 2013-12-28 LAB — CBC
HCT: 32.9 % — ABNORMAL LOW (ref 36.0–46.0)
Hemoglobin: 10.7 g/dL — ABNORMAL LOW (ref 12.0–15.0)
MCH: 27 pg (ref 26.0–34.0)
MCHC: 32.5 g/dL (ref 30.0–36.0)
MCV: 82.9 fL (ref 78.0–100.0)
PLATELETS: 225 10*3/uL (ref 150–400)
RBC: 3.97 MIL/uL (ref 3.87–5.11)
RDW: 17 % — ABNORMAL HIGH (ref 11.5–15.5)
WBC: 13.4 10*3/uL — ABNORMAL HIGH (ref 4.0–10.5)

## 2013-12-28 MED ORDER — NICOTINE 14 MG/24HR TD PT24: 14.0000 mg | MEDICATED_PATCH | Freq: Every day | TRANSDERMAL | Status: AC

## 2013-12-28 MED ORDER — OXYCODONE-ACETAMINOPHEN 5-325 MG PO TABS: 1.0000 | ORAL_TABLET | ORAL | Status: AC | PRN

## 2013-12-28 NOTE — Anesthesia Postprocedure Evaluation (Signed)
Anesthesia Post Note  Patient: Amy [name]  Procedure(s) Performed: Procedure(s) (LRB): ROBOTIC ASSISTED TOTAL HYSTERECTOMY, BILATERAL SALPINGECTOMY  (Bilateral) OOPHORECTOMY RIGHT (Right) CYSTOSCOPY (N/A)  Anesthesia type: General  Patient location: Mother/Baby  Post pain: Pain level controlled  Post assessment: Post-op Vital signs reviewed  Last Vitals:  Filed Vitals:   12/28/13 0520  BP: 135/88  Pulse: 72  Temp: 37 C  Resp: 18    Post vital signs: Reviewed  Level of consciousness: awake and alert   Complications: No apparent anesthesia complications

## 2013-12-28 NOTE — Discharge Summary (Signed)
  Pt presented for [place] and Left salpingectomy for menorrhagia.  See op report for details.  She did well postoperatively, tolerating food, ambulation, passing gas, pain controlled, no fever and normal vital signs, Hb of 10.7, WBC of 13.4.  Discharge instructions given and Rx for 14g nicotine patches and Percocet given.  Pt will f/u at already scheduled post-op appt.

## 2013-12-28 NOTE — Discharge Instructions (Signed)
No heavy lifting (above 15lbs) No driving for at least 2 weeks. Nothing in the vagina for at least 10 weeks. Healthy diet, walk as tolerated. Continue nicotine patch to help with smoking cessation. Call with increasing pain, bleeding, fever, or other concerns. Follow up at scheduled appt.

## 2013-12-28 NOTE — Addendum Note (Signed)
Addendum created 12/28/13 0752 by Talbot Grumbling, CRNA   Modules edited: Notes Section   Notes Section:  File: [id number]

## 2014-01-26 ENCOUNTER — Other Ambulatory Visit: Payer: Self-pay | Admitting: Obstetrics and Gynecology

## 2014-01-26 DIAGNOSIS — N63 Unspecified lump in unspecified breast: Secondary | ICD-10-CM

## 2014-01-26 NOTE — Op Note (Signed)
[name], [name]            ACCOUNT NO.:  [id number]  MEDICAL RECORD NO.:  [id number]  LOCATION:  9312                          FACILITY:  [facility]  PHYSICIAN:  Allyn Kenner, DO    DATE OF BIRTH:  1971/05/14  DATE OF PROCEDURE:  12/27/2013 DATE OF DISCHARGE:  12/28/2013                              OPERATIVE REPORT   PREOPERATIVE DIAGNOSIS:  Menorrhagia.  POSTOPERATIVE DIAGNOSIS:  Menorrhagia.  PROCEDURE:  Robotic-assisted total laparoscopic hysterectomy, bilateral salpingectomy, and right oophorectomy with cystoscopy.  SURGEON:  Allyn Kenner, DO.  ASSISTANT:  Bobbye Charleston, M.D.  ANESTHESIA:  Local and general.  ESTIMATED BLOOD LOSS:  150 mL.  IV FLUIDS:  2000 mL.  URINE OUTPUT:  450 mL.  LOCAL MEDICATION:  Marcaine at skin.  SPECIMEN:  Aspirate from hydrosalpinx, uterus, cervix, bilateral tubes and right ovary to Pathology.  FINDINGS:  Normal-appearing abdomen, right hydrosalpinx with scar tissue involving right ovary, normal-appearing left tube.  No other abnormalities noted.  COMPLICATIONS:  None.  CONDITION:  Stable to PACU.  DESCRIPTION OF PROCEDURE:  The patient was taken to the operating room where general anesthesia was administered and found to be adequate.  She was prepped and draped in the normal sterile fashion.  A Foley catheter was inserted.  A long weighted speculum was placed into the vagina and the anterior wall retractor was placed into the anterior vagina.  The cervix was grasped with a single-tooth tenaculum and the uterus was sounded to approximately 7 cm.  The balloon manipulator was then properly placed with KOH properly sized and fit over the cervix. Attention was then turned to the abdomen where an incision was made just superior to the umbilicus of approximately 11 mm.  Veress needle was introduced and gas was placed on low flow with high-pressure noted. Veress was replaced and again high-pressure was noted.  Decision  was made to do Hasson entry.  Metzenbaum scissors were used to incise subcutaneous tissue.  The fascia was visualized, grasped and tented.  At this point, Veress needle was entered.  Saline test was positive.  Low- flow gas was started and found to be low pressure.  High gas was then started and pneumoperitoneum was created.  Once adequate pneumoperitoneum was obtained, two additional ports were placed lateral and caudal to the umbilicus by approximately 8 cm.  Skin incision was made after Marcaine was placed and camera was used to visualize entry of both ports.  An additional 5-mm port was placed below the left 8-mm port, this also under direct visualization after Marcaine was introduced.  The skin was incised and trocar was entered.  The da Vinci robot was then docked in the normal fashion, then the patient was placed in steep Trendelenburg.  Inspection of the pelvis was performed and noted above.  The left fallopian tube was cauterized using peak PK bipolar cautery and ligated with scissors.  The utero-ovarian ligament was also coagulated and cut.  The round ligament was coagulated and cut, and the bladder flap was created with monopolar shears and the bladder was dissected down from the cervix.  This entire procedure was repeated on the right side; however, due to hydrosalpinx and scar tissue involving the right  ovary, the round ligament was cauterized, cut, and broad ligament opened parallel to the IP ligament.  After ureter was visualized and found to be low, the IP ligament was coagulated and cut. The round ligament was coagulated and cut, and bladder flap created along the right side using monopolar shears.  The bladder flap was dissected down from the cervix on that side as well.  LigaSure was used to coagulate both uterine arteries after again ureters were identified bilaterally.  The monopolar scissors were used to create colpotomy over the KOH ring and brought laterally on  both sides.  This was repeated posteriorly from the margin of the uterus from the surrounding vagina. The uterus was then delivered through the vagina and the vaginal cuff was closed with four figure-of-eight sutures of 0 Vicryl.  The entire pelvis was found to be hemostatic after irrigation and instruments were removed.  The robot was undocked.  The trocars were removed and umbilical fascia was closed with 0 Vicryl.  Attention was then turned to cystoscopy.  The cystoscope was entered, the bladder surveyed and found to be normal with no injury.  Bilateral ureter jets were noted.  All four skin incisions were closed with 4-0 Monocryl subcuticularly. Dermabond was placed over the top.  All needle, sponge, and instrument counts were correct.  The patient tolerated the procedure well, and was taken to recovery in stable condition.          ______________________________ Allyn Kenner, DO     [place]/MEDQ  D:  01/25/2014  T:  01/26/2014  Job:  [id number]

## 2014-02-05 ENCOUNTER — Other Ambulatory Visit: Payer: Medicaid Other

## 2014-02-07 ENCOUNTER — Other Ambulatory Visit: Payer: Medicaid Other

## 2014-02-13 ENCOUNTER — Other Ambulatory Visit: Payer: Medicaid Other

## 2016-06-18 ENCOUNTER — Emergency Department (HOSPITAL_COMMUNITY): Payer: No Typology Code available for payment source

## 2016-06-18 ENCOUNTER — Emergency Department (HOSPITAL_COMMUNITY)
Admission: EM | Admit: 2016-06-18 | Discharge: 2016-06-19 | Disposition: A | Discharge status: Home / Self Care | Payer: No Typology Code available for payment source | Attending: Emergency Medicine | Admitting: Emergency Medicine

## 2016-06-18 ENCOUNTER — Encounter (HOSPITAL_COMMUNITY): Payer: Self-pay

## 2016-06-18 DIAGNOSIS — F17200 Nicotine dependence, unspecified, uncomplicated: Secondary | ICD-10-CM | POA: Diagnosis not present

## 2016-06-18 DIAGNOSIS — Y999 Unspecified external cause status: Secondary | ICD-10-CM | POA: Insufficient documentation

## 2016-06-18 DIAGNOSIS — S299XXA Unspecified injury of thorax, initial encounter: Secondary | ICD-10-CM | POA: Diagnosis present

## 2016-06-18 DIAGNOSIS — Z7982 Long term (current) use of aspirin: Secondary | ICD-10-CM | POA: Insufficient documentation

## 2016-06-18 DIAGNOSIS — R0789 Other chest pain: Secondary | ICD-10-CM

## 2016-06-18 DIAGNOSIS — Y939 Activity, unspecified: Secondary | ICD-10-CM | POA: Diagnosis not present

## 2016-06-18 DIAGNOSIS — M25531 Pain in right wrist: Secondary | ICD-10-CM

## 2016-06-18 DIAGNOSIS — R10811 Right upper quadrant abdominal tenderness: Secondary | ICD-10-CM | POA: Insufficient documentation

## 2016-06-18 DIAGNOSIS — Z91040 Latex allergy status: Secondary | ICD-10-CM | POA: Diagnosis not present

## 2016-06-18 DIAGNOSIS — J45909 Unspecified asthma, uncomplicated: Secondary | ICD-10-CM | POA: Insufficient documentation

## 2016-06-18 DIAGNOSIS — R10822 Left upper quadrant rebound abdominal tenderness: Secondary | ICD-10-CM | POA: Diagnosis not present

## 2016-06-18 DIAGNOSIS — Y92410 Unspecified street and highway as the place of occurrence of the external cause: Secondary | ICD-10-CM | POA: Diagnosis not present

## 2016-06-18 LAB — BASIC METABOLIC PANEL
ANION GAP: 9 (ref 5–15)
BUN: 9 mg/dL (ref 6–20)
CO2: 24 mmol/L (ref 22–32)
Calcium: 9 mg/dL (ref 8.9–10.3)
Chloride: 106 mmol/L (ref 101–111)
Creatinine, Ser: 0.67 mg/dL (ref 0.44–1.00)
GFR calc non Af Amer: >60 mL/min (ref >60)
GLUCOSE: 82 mg/dL (ref 65–99)
POTASSIUM: 3.4 mmol/L — AB (ref 3.5–5.1)
SODIUM: 139 mmol/L (ref 135–145)

## 2016-06-18 LAB — CBC WITH DIFFERENTIAL/PLATELET
BASOS ABS: 0 10*3/uL (ref 0.0–0.1)
BASOS PCT: 0 %
EOS PCT: 5 %
Eosinophils Absolute: 0.3 10*3/uL (ref 0.0–0.7)
HCT: 37.1 % (ref 36.0–46.0)
Hemoglobin: 12.4 g/dL (ref 12.0–15.0)
LYMPHS PCT: 44 %
Lymphs Abs: 2.6 10*3/uL (ref 0.7–4.0)
MCH: 27.9 pg (ref 26.0–34.0)
MCHC: 33.4 g/dL (ref 30.0–36.0)
MCV: 83.6 fL (ref 78.0–100.0)
Monocytes Absolute: 0.7 10*3/uL (ref 0.1–1.0)
Monocytes Relative: 12 %
Neutro Abs: 2.3 10*3/uL (ref 1.7–7.7)
Neutrophils Relative %: 39 %
PLATELETS: 195 10*3/uL (ref 150–400)
RBC: 4.44 MIL/uL (ref 3.87–5.11)
RDW: 15.7 % — ABNORMAL HIGH (ref 11.5–15.5)
WBC: 5.8 10*3/uL (ref 4.0–10.5)

## 2016-06-18 LAB — I-STAT BETA HCG BLOOD, ED (MC, WL, AP ONLY): I-stat hCG, quantitative: <5 m[IU]/mL (ref <5)

## 2016-06-18 MED ORDER — OXYCODONE-ACETAMINOPHEN 5-325 MG PO TABS
1.0000 | ORAL_TABLET | Freq: Once | ORAL | Status: AC
  Administered 2016-06-19: 1 via ORAL
  Filled 2016-06-18: qty 1

## 2016-06-18 MED ORDER — IOPAMIDOL (ISOVUE-300) INJECTION 61%
INTRAVENOUS | Status: AC
  Administered 2016-06-18: 100 mL
  Filled 2016-06-18: qty 100

## 2016-06-18 NOTE — ED Notes (Signed)
Pt upon arrival refused to the hooked up to the monitor until she was allowed to walk to the bathroom.

## 2016-06-18 NOTE — ED Notes (Signed)
Pt stated that she needed to use the bathroom. Advised pt that due to her concerns and current complaints it would be best for her to be seen by RN and to at a minimum let staff complete the required EKG. Pt was offered bed pan or bed side commode. Pt did not want to use either and simply walked out of the room to the bathroom. PT ambulated with steady gait and without assistance. RN notified.
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
## 2016-06-18 NOTE — ED Notes (Signed)
Patient transported to CT 
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
## 2016-06-19 MED ORDER — IBUPROFEN 800 MG PO TABS: 800.0000 mg | ORAL_TABLET | Freq: Three times a day (TID) | ORAL | 0 refills | Status: AC | PRN

## 2016-06-19 MED ORDER — METHOCARBAMOL 500 MG PO TABS: 500.0000 mg | ORAL_TABLET | Freq: Two times a day (BID) | ORAL | 0 refills | Status: AC | PRN
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
## 2017-09-24 ENCOUNTER — Encounter (HOSPITAL_COMMUNITY): Payer: Self-pay | Admitting: Emergency Medicine

## 2017-09-24 ENCOUNTER — Ambulatory Visit (HOSPITAL_COMMUNITY)
Admission: EM | Admit: 2017-09-24 | Discharge: 2017-09-24 | Disposition: A | Discharge status: Home / Self Care | Payer: Self-pay | Attending: Internal Medicine | Admitting: Internal Medicine

## 2017-09-24 DIAGNOSIS — M25561 Pain in right knee: Secondary | ICD-10-CM

## 2017-09-24 MED ORDER — DICLOFENAC SODIUM 75 MG PO TBEC: 75.0000 mg | DELAYED_RELEASE_TABLET | Freq: Two times a day (BID) | ORAL | 0 refills | Status: AC

## 2017-09-24 MED ORDER — PREDNISONE 50 MG PO TABS: 50.0000 mg | ORAL_TABLET | Freq: Every day | ORAL | 0 refills | Status: AC

## 2017-09-24 NOTE — ED Provider Notes (Signed)
[place]    CSN: [id number] Arrival date & time: 09/24/17  1817     History   Chief Complaint Chief Complaint  Patient presents with  . Knee Pain    HPI Amy [name] is a 47 y.o. female no contributing past medical history presenting today with right knee pain.  She states her pain has been going on for approximately 1 week.  It gradually came on and has persisted.  She notices the pain most when she is going up and down stairs and applying weight to a bended knee.  She feels the pain behind the kneecap.  She denies any sensation of instability.  She does endorse a sensation of popping last night while walking although this sensation happened after the pain had started.  Denies any specific injury that initiated this pain.  She does endorse some swelling within the when she wore heels on Sunday.  She has been taking ibuprofen and BC powder, but has not been doing either of these consistently.  HPI  Past Medical History:  Diagnosis Date  . Anemia    HISTORY OF ANEMIA  . Asthma   . GERD (gastroesophageal reflux disease)   . History of blood transfusion 06/2002   with 3rd child  [place], [place] - unknown units transfused  . Seasonal allergies   . SVD (spontaneous vaginal delivery)    x 3    Patient Active Problem List   Diagnosis Date Noted  . S/P hysterectomy 12/27/2013    Past Surgical History:  Procedure Laterality Date  . CYSTOSCOPY N/A 12/27/2013   Procedure: CYSTOSCOPY;  Surgeon: Allyn Kenner, DO;  Location: [facility];  Service: Gynecology;  Laterality: N/A;  . HYSTEROSCOPY W/D&C N/A 10/09/2013   Procedure: DILATATION AND CURETTAGE /HYSTEROSCOPY;  Surgeon: Allyn Kenner, DO;  Location: [facility];  Service: Gynecology;  Laterality: N/A;  . OOPHORECTOMY Right 12/27/2013   Procedure: OOPHORECTOMY RIGHT;  Surgeon: Allyn Kenner, DO;  Location: [facility];  Service: Gynecology;  Laterality: Right;  . ROBOTIC ASSISTED TOTAL HYSTERECTOMY Bilateral 12/27/2013   Procedure: ROBOTIC ASSISTED TOTAL HYSTERECTOMY, BILATERAL SALPINGECTOMY ;  Surgeon: Allyn Kenner, DO;  Location: [facility];  Service: Gynecology;  Laterality: Bilateral;  . TUBAL LIGATION    . WISDOM TOOTH EXTRACTION      OB History    No data available       Home Medications    Prior to Admission medications   Medication Sig Start Date End Date Taking? Authorizing Provider  albuterol (PROVENTIL HFA;VENTOLIN HFA) 108 (90 BASE) MCG/ACT inhaler Inhale 2 puffs into the lungs every 6 (six) hours as needed for wheezing or shortness of breath.   Yes [provider]  Aspirin-Salicylamide-Caffeine (BC HEADACHE POWDER PO) Take 1 packet by mouth every 12 (twelve) hours as needed (for headaches).    Yes [provider]  ibuprofen (ADVIL,MOTRIN) 800 MG tablet Take 1 tablet (800 mg total) by mouth every 8 (eight) hours as needed. 06/19/16  Yes Ward, Ozella Almond, PA-C  diclofenac (VOLTAREN) 75 MG EC tablet Take 1 tablet (75 mg total) by mouth 2 (two) times daily for 10 days. 09/24/17 10/04/17  Wieters, Hallie C, PA-C  Fluticasone Propionate HFA (FLOVENT HFA IN) Inhale 2 puffs into the lungs 2 (two) times daily.    [provider]  methocarbamol (ROBAXIN) 500 MG tablet Take 1 tablet (500 mg total) by mouth 2 (two) times daily as needed for muscle spasms. 06/19/16   Ward, Ozella Almond, PA-C  nicotine (NICODERM CQ -  DOSED IN MG/24 HOURS) 14 mg/24hr patch Place 1 patch (14 mg total) onto the skin daily. Patient not taking: Reported on 06/18/2016 12/28/13   Allyn Kenner, DO  oxyCODONE-acetaminophen (PERCOCET/ROXICET) 5-325 MG per tablet Take 1-2 tablets by mouth every 4 (four) hours as needed for severe pain (moderate to severe pain (when tolerating fluids)). Patient not taking: Reported on 06/18/2016 12/28/13   Allyn Kenner, DO  Phenyleph-CPM-DM-APAP (TYLENOL COLD HEAD CONGESTION PO) Take 5-10 mLs by mouth every 8 (eight) hours as needed (for congestion/cold symptoms).     [provider]  predniSONE (DELTASONE) 50 MG tablet Take 1 tablet (50 mg total) by mouth daily for 4 days. 09/24/17 09/28/17  Wieters, Hallie C, PA-C  Pseudoeph-Doxylamine-DM-APAP (NYQUIL MULTI-SYMPTOM PO) Take 5-10 mLs by mouth every 8 (eight) hours as needed (for cold-like symptoms).     [provider]    Family History History reviewed. No pertinent family history.  Social History Social History   Tobacco Use  . Smoking status: Current Every Day Smoker    Packs/day: 0.25    Years: 15.00    Pack years: 3.75  . Smokeless tobacco: Never Used  Substance Use Topics  . Alcohol use: Yes    Comment: socially  . Drug use: No     Allergies   Latex   Review of Systems Review of Systems  Constitutional: Negative for fatigue and fever.  Musculoskeletal: Positive for arthralgias and joint swelling.  Skin: Negative for pallor.  Neurological: Negative for weakness and numbness.     Physical Exam Triage Vital Signs ED Triage Vitals  Enc Vitals Group     BP 09/24/17 1947 134/76     Pulse Rate 09/24/17 1947 70     Resp 09/24/17 1947 20     Temp 09/24/17 1947 98.1 F (36.7 C)     Temp Source 09/24/17 1947 Oral     SpO2 09/24/17 1947 100 %     Weight --      Height --      Head Circumference --      Peak Flow --      Pain Score 09/24/17 1948 7     Pain Loc --      Pain Edu? --      Excl. in [place]? --    No data found.  Updated Vital Signs BP 134/76 (BP Location: Left Arm)   Pulse 70   Temp 98.1 F (36.7 C) (Oral)   Resp 20   LMP 12/14/2013   SpO2 100%    Physical Exam  Constitutional: She is oriented to person, place, and time. She appears well-developed and well-nourished. No distress.  HENT:  Head: Normocephalic and atraumatic.  Eyes: Conjunctivae are normal.  Neck: Neck supple.  Cardiovascular: Normal rate.  Pulmonary/Chest: Effort normal. No respiratory distress.  Musculoskeletal: She exhibits no edema or deformity.  No obvious swelling or  deformity to right knee.  Tenderness to palpation along anterior medial joint line, anterior lateral joint line and anterior tibial tubercle.  She has full range of motion of knee.  No laxity appreciated with special tests including varus and valgus stress, Lachman's.  Negative McMurray's.  Strength 4 out of 5 compared to left and knee flexion and extension.  She is has pain with this.  Neurological: She is alert and oriented to person, place, and time.  Skin: Skin is warm and dry.  Psychiatric: She has a normal mood and affect.  Nursing note and vitals reviewed.  UC Treatments / Results  Labs (all labs ordered are listed, but only abnormal results are displayed) Labs Reviewed - No data to display  EKG  EKG Interpretation None       Radiology No results found.  Procedures Procedures (including critical care time)  Medications Ordered in UC Medications - No data to display   Initial Impression / Assessment and Plan / UC Course  I have reviewed the triage vital signs and the nursing notes.  Pertinent labs & imaging results that were available during my care of the patient were reviewed by me and considered in my medical decision making (see chart for details).     Patient with right knee pain most likely from arthritis versus other inflammatory disorder.  Advised to take diclofenac consistently for 10 days or take Tylenol and ibuprofen together.  Take prednisone daily for 4 days.  Follow-up with primary provider or orthopedics in approximately 1-2 weeks if pain not improving.  Advised also ice and rest. Discussed strict return precautions. Patient verbalized understanding and is agreeable with plan.   Final Clinical Impressions(s) / UC Diagnoses   Final diagnoses:  Acute pain of right knee    ED Discharge Orders        Ordered    diclofenac (VOLTAREN) 75 MG EC tablet  2 times daily     09/24/17 2035    predniSONE (DELTASONE) 50 MG tablet  Daily     09/24/17 2035         Controlled Substance Prescriptions [place] Controlled Substance Registry consulted? Not Applicable   Janith Lima, [place] 09/24/17 2252

## 2017-09-24 NOTE — Discharge Instructions (Signed)
Please follow-up with your primary care or orthopedics for further knee pain persisting beyond 1-2 weeks.  Use anti-inflammatories for pain/swelling. Please take diclofenac twice daily.  You may take Tylenol with this. OR You may take up to 800 mg Ibuprofen every 8 hours with food. You may supplement Ibuprofen with Tylenol [phone] mg every 8 hours.  Do not take both diclofenac and ibuprofen.  Please take prednisone daily for 4 days.  Apply ice and heating pad alternating every 20-30 minutes.  You may wear knee sleeve provided as needed for comfort and support.

## 2017-09-24 NOTE — ED Triage Notes (Signed)
PT C/O: right knee pain onset 1 week.... Reports pain increases when she goes up and down the stairs  DENIES: inj/trauma   TAKING MEDS: Ibuprofen   A&O x4... NAD... Ambulatory
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
## 2022-05-08 ENCOUNTER — Other Ambulatory Visit: Payer: Self-pay | Admitting: Family

## 2022-05-08 DIAGNOSIS — Z1231 Encounter for screening mammogram for malignant neoplasm of breast: Secondary | ICD-10-CM

## 2022-06-19 ENCOUNTER — Ambulatory Visit: Payer: Medicaid Other

## 2022-08-19 ENCOUNTER — Ambulatory Visit: Payer: Medicaid Other

## 2023-03-09 ENCOUNTER — Other Ambulatory Visit: Payer: Self-pay

## 2023-03-09 ENCOUNTER — Emergency Department (HOSPITAL_COMMUNITY): Payer: Medicaid Other

## 2023-03-09 ENCOUNTER — Emergency Department (HOSPITAL_COMMUNITY)
Admission: EM | Admit: 2023-03-09 | Discharge: 2023-03-09 | Disposition: A | Discharge status: Home / Self Care | Payer: Medicaid Other | Attending: Emergency Medicine | Admitting: Emergency Medicine

## 2023-03-09 ENCOUNTER — Encounter (HOSPITAL_COMMUNITY): Payer: Self-pay

## 2023-03-09 DIAGNOSIS — Y9302 Activity, running: Secondary | ICD-10-CM | POA: Insufficient documentation

## 2023-03-09 DIAGNOSIS — M25562 Pain in left knee: Secondary | ICD-10-CM | POA: Diagnosis not present

## 2023-03-09 DIAGNOSIS — Z91040 Latex allergy status: Secondary | ICD-10-CM | POA: Insufficient documentation

## 2023-03-09 DIAGNOSIS — W19XXXA Unspecified fall, initial encounter: Secondary | ICD-10-CM | POA: Insufficient documentation

## 2023-03-09 MED ORDER — HYDROCODONE-ACETAMINOPHEN 5-325 MG PO TABS
1.0000 | ORAL_TABLET | Freq: Once | ORAL | Status: AC
  Administered 2023-03-09: 1 via ORAL
  Filled 2023-03-09: qty 1

## 2023-03-09 MED ORDER — MELOXICAM 15 MG PO TABS: 15.0000 mg | ORAL_TABLET | Freq: Every day | ORAL | 0 refills | Status: AC

## 2023-03-09 NOTE — Discharge Instructions (Signed)
You were evaluated today for knee pain. The immobilizer should be worn when ambulating. I do recommend using crutches if they help your pain. I prescribed an antiinflammatory medicine. Do not take other NSAIDS while taking this medication. Please follow up with orthopedic surgery

## 2023-03-09 NOTE — ED Provider Notes (Signed)
[facility] EMERGENCY DEPARTMENT AT [facility] Provider Note   CSN: [id number] Arrival date & time: 03/09/23  0250     History  Chief Complaint  Patient presents with   Knee Pain    Amy [name] is a 52 y.o. female. Patient presents to the emergency department complaining of left sided knee pain. Patient states that she was trying to catch her dog which was running out the door when she fell. She states she may have twisted her knee and heard an audible "pop". She complains of knee swelling and pain with weight bearing. Past medical history significant for asthma, anemia, GERD  HPI     Home Medications Prior to Admission medications   Medication Sig Start Date End Date Taking? Authorizing Provider  meloxicam (MOBIC) 15 MG tablet Take 1 tablet (15 mg total) by mouth daily for 15 days. 03/09/23 03/24/23 Yes Darrick Grinder, PA-C  albuterol (PROVENTIL HFA;VENTOLIN HFA) 108 (90 BASE) MCG/ACT inhaler Inhale 2 puffs into the lungs every 6 (six) hours as needed for wheezing or shortness of breath.    [provider]  Aspirin-Salicylamide-Caffeine (BC HEADACHE POWDER PO) Take 1 packet by mouth every 12 (twelve) hours as needed (for headaches).     [provider]  Fluticasone Propionate HFA (FLOVENT HFA IN) Inhale 2 puffs into the lungs 2 (two) times daily.    [provider]  ibuprofen (ADVIL,MOTRIN) 800 MG tablet Take 1 tablet (800 mg total) by mouth every 8 (eight) hours as needed. 06/19/16   Ward, Chase Picket, PA-C  methocarbamol (ROBAXIN) 500 MG tablet Take 1 tablet (500 mg total) by mouth 2 (two) times daily as needed for muscle spasms. 06/19/16   Ward, Chase Picket, PA-C  nicotine (NICODERM CQ - DOSED IN MG/24 HOURS) 14 mg/24hr patch Place 1 patch (14 mg total) onto the skin daily. Patient not taking: Reported on 06/18/2016 12/28/13   Philip Aspen, DO  oxyCODONE-acetaminophen (PERCOCET/ROXICET) 5-325 MG per tablet Take 1-2 tablets by mouth  every 4 (four) hours as needed for severe pain (moderate to severe pain (when tolerating fluids)). Patient not taking: Reported on 06/18/2016 12/28/13   Philip Aspen, DO  Phenyleph-CPM-DM-APAP (TYLENOL COLD HEAD CONGESTION PO) Take 5-10 mLs by mouth every 8 (eight) hours as needed (for congestion/cold symptoms).    [provider]  Pseudoeph-Doxylamine-DM-APAP (NYQUIL MULTI-SYMPTOM PO) Take 5-10 mLs by mouth every 8 (eight) hours as needed (for cold-like symptoms).     [provider]      Allergies    Latex    Review of Systems   Review of Systems  Physical Exam Updated Vital Signs BP (!) 139/92 (BP Location: Right Arm)   Pulse 93   Temp 98.2 F (36.8 C) (Oral)   Resp 16   LMP 12/14/2013   SpO2 99%  Physical Exam Vitals and nursing note reviewed.  HENT:     Head: Normocephalic and atraumatic.  Eyes:     Conjunctiva/sclera: Conjunctivae normal.  Pulmonary:     Effort: Pulmonary effort is normal. No respiratory distress.  Musculoskeletal:        General: Swelling, tenderness and signs of injury present. No deformity.     Cervical back: Normal range of motion.     Comments: Patient's left knee with mild swelling, positive McMurray test.. Patient's ROM limited due to pain. No warmth or erythema.   Skin:    General: Skin is dry.  Neurological:     Mental Status: She is alert.  Psychiatric:  Speech: Speech normal.        Behavior: Behavior normal.     ED Results / Procedures / Treatments   Labs (all labs ordered are listed, but only abnormal results are displayed) Labs Reviewed - No data to display  EKG None  Radiology DG Knee Complete 4 Views Left  Result Date: 03/09/2023 CLINICAL DATA:  Fall EXAM: LEFT KNEE - COMPLETE 4+ VIEW COMPARISON:  None Available. FINDINGS: There is a large joint effusion. No acute bony abnormality. Specifically, no fracture, subluxation, or dislocation. IMPRESSION: Large joint effusion. No acute bony abnormality.  Electronically Signed   By: Charlett Nose M.D.   On: 03/09/2023 03:40    Procedures .Ortho Injury Treatment  Date/Time: 03/09/2023 4:48 AM  Performed by: Darrick Grinder, PA-C Authorized by: Darrick Grinder, PA-C   Consent:    Consent obtained:  Verbal   Consent given by:  Patient   Risks discussed:  Stiffness, restricted joint movement, vascular damage and nerve damageInjury location: knee Location details: left knee Injury type: soft tissue Pre-procedure neurovascular assessment: neurovascularly intact Immobilization: brace Splint Applied by: Milon Dikes Post-procedure neurovascular assessment: post-procedure neurovascularly intact       Medications Ordered in ED Medications  HYDROcodone-acetaminophen (NORCO/VICODIN) 5-325 MG per tablet 1 tablet (1 tablet Oral Given 03/09/23 0430)    ED Course/ Medical Decision Making/ A&P                             Medical Decision Making Amount and/or Complexity of Data Reviewed Radiology: ordered.  Risk Prescription drug management.   Patient presents to the emergency department complaining of left-sided knee pain.  Differential diagnosis includes but is not limited to meniscal injury, ligamentous injury, fracture, dislocation, others  I ordered and interpreted imaging including plain films of the left knee. A large effusion was noted with no bony abnormality. I agree with the radiologist's findings.   I ordered the patient a hydrocodone for pain. Upon reassessment her pain had improved.   The patient was placed in an immobilizer and provided crutches. Physical exam concerning for possible meniscal or ligamentous injury. No fracture or dislocation noted on imaging. Plan to discharge home with meloxicam and recommendation for orthopedic follow up.         Final Clinical Impression(s) / ED Diagnoses Final diagnoses:  Acute pain of left knee    Rx / DC Orders ED Discharge Orders          Ordered    meloxicam (MOBIC)  15 MG tablet  Daily        03/09/23 0455              Darrick Grinder, PA-C 03/09/23 0500    Palumbo, April, MD 03/09/23 0501

## 2023-03-09 NOTE — Progress Notes (Signed)
Orthopedic Tech Progress Note Patient Details:  Amy [name] May 02, 1971 [id number]  Ortho Devices Type of Ortho Device: Crutches, Knee Immobilizer Ortho Device/Splint Location: lle Ortho Device/Splint Interventions: Ordered, Application, Adjustment  I applied the brace and taught them how to apply it. Then they got up and walked. Post Interventions Patient Tolerated: Well Instructions Provided: Care of device, Adjustment of device  [name] 03/09/2023, 4:49 AM

## 2023-03-09 NOTE — ED Triage Notes (Signed)
Pt arrived from home via POV s/p fall with injury to left knee in which she states that she heard a pop. Pt has limited ROM. 10/10 on pain scale
